# Patient Record
Sex: Male | Born: 1980
Health system: Southern US, Community
[De-identification: ages and names within clinical notes are randomized; demographics above are authoritative.]

## PROBLEM LIST (undated history)

## (undated) DIAGNOSIS — F32A Depression, unspecified: Secondary | ICD-10-CM

## (undated) DIAGNOSIS — R112 Nausea with vomiting, unspecified: Secondary | ICD-10-CM

## (undated) DIAGNOSIS — Z9889 Other specified postprocedural states: Secondary | ICD-10-CM

## (undated) DIAGNOSIS — F329 Major depressive disorder, single episode, unspecified: Secondary | ICD-10-CM

## (undated) DIAGNOSIS — F419 Anxiety disorder, unspecified: Secondary | ICD-10-CM

## (undated) HISTORY — PX: TONSILLECTOMY: SUR1361

## (undated) SURGERY — Surgical Case
Anesthesia: *Unknown

---

## 2008-10-28 ENCOUNTER — Emergency Department (HOSPITAL_COMMUNITY): Admission: EM | Admit: 2008-10-28 | Discharge: 2008-10-28 | Payer: Self-pay | Admitting: Emergency Medicine

## 2018-05-21 ENCOUNTER — Other Ambulatory Visit: Payer: Self-pay

## 2018-05-21 ENCOUNTER — Ambulatory Visit: Payer: 59 | Admitting: Physician Assistant

## 2018-05-21 ENCOUNTER — Encounter: Payer: Self-pay | Admitting: Physician Assistant

## 2018-05-21 VITALS — BP 120/80 | HR 97 | Resp 16 | Ht 73.0 in | Wt 201.0 lb

## 2018-05-21 DIAGNOSIS — F4323 Adjustment disorder with mixed anxiety and depressed mood: Secondary | ICD-10-CM | POA: Diagnosis not present

## 2018-05-21 MED ORDER — TRAZODONE HCL 50 MG PO TABS: 25.0000 mg | ORAL_TABLET | Freq: Every evening | ORAL | 3 refills | Status: DC | PRN

## 2018-05-21 MED ORDER — ESCITALOPRAM OXALATE 10 MG PO TABS: 5.0000 mg | ORAL_TABLET | Freq: Every day | ORAL | 3 refills | Status: DC

## 2018-05-21 MED ORDER — BUSPIRONE HCL 10 MG PO TABS: 5.0000 mg | ORAL_TABLET | Freq: Three times a day (TID) | ORAL | 0 refills | Status: DC

## 2018-05-21 NOTE — Patient Instructions (Addendum)
  Call [phone] to get set up with counseling. Her name is Corey SavoyBarbara Doyle.  Start the meds today.  Don't miss Lexapro unless you see me first.    IF you received an x-ray today, you will receive an invoice from [facility]. Please contact [facility] at [phone] with questions or concerns regarding your invoice.   IF you received labwork today, you will receive an invoice from [facility]. Please contact [facility] at [phone] with questions or concerns regarding your invoice.   Our billing staff will not be able to assist you with questions regarding bills from these companies.  You will be contacted with the lab results as soon as they are available. The fastest way to get your results is to activate your My Chart account. Instructions are located on the last page of this paperwork. If you have not heard from us regarding the results in 2 weeks, please contact this office.

## 2018-05-21 NOTE — Progress Notes (Signed)
05/21/2018 10:45 AM   DOB: 1981/05/29 / MRN: [id number]  SUBJECTIVE:  Corey Doyle is a 37 y.o. male presenting for long history of anxiety and depression.  Unfortunately patient is in talk with his wife at this time about divorce.  He is got a 29-year-old and a 53-year-old at home whom he cares about deeply.  He does not want a divorce.  His sleep is poor generally and if he does not take a sleep aid sleep 1 to 2 hours a night.  Tells me he has no difficulty with going to sleep but will most often wake up and cannot go back to sleep.  States that if he has a sleep aid he will sleep 4 to 5 hours.  Feels that sometimes he would be better off dead however has no plan for suicide and denies homicidality.  He tells me "I would never do it."  Works for [employer] and has been there for about 7 years.  No write-ups recently and tells me that he is functioning well at work.  Admits to both persistent sadness along with anhedonia.  He is tearful through most of the interview.  He is not opposed to seeing a counselor if I feel that he needs to.  He does drink wine on most nights and is tried to stop at the request of his wife in the past and he tells me "when quit drinking it did not make any difference in the relationship."   There is no immunization history on file for this patient.  Depression screen PHQ 2/9 05/21/2018  Decreased Interest 3  Down, Depressed, Hopeless 2  PHQ - 2 Score 5  Altered sleeping 3  Tired, decreased energy 3  Change in appetite 2  Feeling bad or failure about yourself  3  Trouble concentrating 2  Moving slowly or fidgety/restless 2  Suicidal thoughts 1  PHQ-9 Score 21  Difficult doing work/chores Somewhat difficult     He has No Known Allergies.   He  has no past medical history on file.    He  reports that he has never smoked. He has never used smokeless tobacco. He reports that he drinks about 6.0 oz of alcohol per week. He reports that he has current or past drug  history. Drug: Cocaine. He  has no sexual activity history on file. The patient  has no past surgical history on file.  His family history includes Hyperlipidemia in his mother.  Review of Systems  Psychiatric/Behavioral: Positive for depression and substance abuse. Negative for hallucinations, memory loss and suicidal ideas. The patient is nervous/anxious and has insomnia.     The problem list and medications were reviewed and updated by myself where necessary and exist elsewhere in the encounter.   OBJECTIVE:  BP 120/80   Pulse 97   Resp 16   Ht 6\' 1"  (1.854 m)   Wt 201 lb (91.2 kg)   SpO2 99%   BMI 26.52 kg/m   Physical Exam  Constitutional: He is oriented to person, place, and time. He appears well-developed. He does not appear ill.  Eyes: Pupils are equal, round, and reactive to light. Conjunctivae and EOM are normal.  Cardiovascular: Normal rate, regular rhythm, S1 normal, S2 normal, normal heart sounds, intact distal pulses and normal pulses. Exam reveals no gallop and no friction rub.  No murmur heard. Pulmonary/Chest: Effort normal. No stridor. No respiratory distress. He has no wheezes. He has no rales.  Abdominal: He exhibits  no distension.  Musculoskeletal: Normal range of motion. He exhibits no edema.  Neurological: He is alert and oriented to person, place, and time. No cranial nerve deficit. Coordination normal.  Skin: Skin is warm and dry. He is not diaphoretic.  Psychiatric:  Poor eye contact throughout the interview.  Patient tearful throughout the interview.  Thought process linear and logical.  Speech negative for tangentiality, flight of ideas, rapid and pressured speech.  Behavior is withdrawn.  He is not slowed.  He is not agitated or aggressive.No plan of suicide or homicide.  Judgment is normal.  Cognition is normal.  Nursing note and vitals reviewed.   No results found for this or any previous visit (from the past 72 hour(s)).  No results  found.  ASSESSMENT AND PLAN:  Corey was seen today for establish care.  Diagnoses and all orders for this visit:  Adjustment reaction with anxiety and depression: Patient with significant psychological burden.  I am starting him on Lexapro and pushing the dose up to 10 mg in the first week.  He needs to get some sleep.  For this I given him trazodone and advised to start with a low dose and work his way up as needed to 75 mg.  I have given him BuSpar for panic attacks.  He would like to see him back in about a month. -     escitalopram (LEXAPRO) 10 MG tablet; Take 0.5-1 tablets (5-10 mg total) by mouth daily. Start with 1/2 tab for a week then go to the full tab. -     traZODone (DESYREL) 50 MG tablet; Take 0.5-1.5 tablets (25-75 mg total) by mouth at bedtime as needed for sleep. -     busPIRone (BUSPAR) 10 MG tablet; Take 0.5-1 tablets (5-10 mg total) by mouth 3 (three) times daily. -     Ambulatory referral to Psychology    The patient is advised to call or return to clinic if he does not see an improvement in symptoms, or to seek the care of the closest emergency department if he worsens with the above plan.   Deliah BostonMichael Clark, MHS, PA-C Primary Care at [facility] 05/21/2018 10:45 AM

## 2018-05-25 ENCOUNTER — Encounter: Payer: Self-pay | Admitting: Physician Assistant

## 2018-05-26 ENCOUNTER — Encounter: Payer: Self-pay | Admitting: Physician Assistant

## 2018-06-04 ENCOUNTER — Ambulatory Visit: Payer: 59 | Admitting: Physician Assistant

## 2018-06-04 ENCOUNTER — Other Ambulatory Visit: Payer: Self-pay

## 2018-06-04 ENCOUNTER — Encounter: Payer: Self-pay | Admitting: Physician Assistant

## 2018-06-04 VITALS — BP 132/82 | HR 92 | Temp 98.3°F | Ht 73.43 in | Wt 203.4 lb

## 2018-06-04 DIAGNOSIS — K641 Second degree hemorrhoids: Secondary | ICD-10-CM

## 2018-06-04 DIAGNOSIS — M2352 Chronic instability of knee, left knee: Secondary | ICD-10-CM | POA: Diagnosis not present

## 2018-06-04 MED ORDER — HYDROCORTISONE ACETATE 25 MG RE SUPP: 25.0000 mg | Freq: Two times a day (BID) | RECTAL | 0 refills | Status: DC

## 2018-06-04 MED ORDER — IBUPROFEN 800 MG PO TABS: 400.0000 mg | ORAL_TABLET | Freq: Three times a day (TID) | ORAL | 0 refills | Status: AC

## 2018-06-04 MED ORDER — PSYLLIUM 28 % PO PACK: 1.0000 | PACK | Freq: Two times a day (BID) | ORAL | 3 refills | Status: AC

## 2018-06-04 NOTE — Patient Instructions (Signed)
     IF you received an x-ray today, you will receive an invoice from [facility]. Please contact [facility] at [phone] with questions or concerns regarding your invoice.   IF you received labwork today, you will receive an invoice from [facility]. Please contact [facility] at [phone] with questions or concerns regarding your invoice.   Our billing staff will not be able to assist you with questions regarding bills from these companies.  You will be contacted with the lab results as soon as they are available. The fastest way to get your results is to activate your My Chart account. Instructions are located on the last page of this paperwork. If you have not heard from us regarding the results in 2 weeks, please contact this office.     

## 2018-06-04 NOTE — Progress Notes (Signed)
06/04/2018 3:24 PM   DOB: February 12, 1981 / MRN: [id number]  SUBJECTIVE:  Corey Doyle is a 37 y.o. male presenting for lump about rectum which started after an episode of explosive diarrhea.  This is been present now for about 3 days.  Notes that over the first 24 hours this was exquisitely painful however today is starting to feel somewhat better.  Denies fever, chills, nausea.  Complains of left knee pain this been present for years.  Worse with physical activity and worse with working for consecutive days on concrete floors.  Tells me he had [employer] as a child.  Tried ibuprofen, Aleve, aspirin all which help temporarily but nothing alleviates the pain permanently.  Is willing to try physical therapy.  He has No Known Allergies.   He  has no past medical history on file.    He  reports that he has never smoked. He has never used smokeless tobacco. He reports that he drinks about 6.0 oz of alcohol per week. He reports that he has current or past drug history. Drug: Cocaine. He  has no sexual activity history on file. The patient  has no past surgical history on file.  His family history includes Hyperlipidemia in his mother.  Review of Systems  Gastrointestinal: Positive for diarrhea. Negative for abdominal pain, blood in stool, constipation, melena, nausea and vomiting.  Musculoskeletal: Positive for joint pain. Negative for back pain, falls, myalgias and neck pain.  Skin: Negative for rash.  Psychiatric/Behavioral: Negative for depression, substance abuse and suicidal ideas. The patient is not nervous/anxious and does not have insomnia.     The problem list and medications were reviewed and updated by myself where necessary and exist elsewhere in the encounter.   OBJECTIVE:  BP 132/82 (BP Location: Left Arm, Patient Position: Sitting, Cuff Size: Normal)   Pulse 92   Temp 98.3 F (36.8 C) (Oral)   Ht 6' 1.43" (1.865 m)   Wt 203 lb 6.4 oz (92.3 kg)   SpO2 97%   BMI 26.53  kg/m   Physical Exam  Constitutional: He is oriented to person, place, and time. He appears well-developed. He does not appear ill.  Eyes: Pupils are equal, round, and reactive to light. Conjunctivae and EOM are normal.  Cardiovascular: Normal rate.  Pulmonary/Chest: Effort normal.  Abdominal: He exhibits no distension.  Genitourinary:     Musculoskeletal: Normal range of motion.       Legs: Neurological: He is alert and oriented to person, place, and time. No cranial nerve deficit. Coordination normal.  Skin: Skin is warm and dry. He is not diaphoretic.  Psychiatric: He has a normal mood and affect.  Nursing note and vitals reviewed.   No results found for this or any previous visit (from the past 72 hour(s)).  No results found.  ASSESSMENT AND PLAN:  Corey was seen today for mass and pain.  Diagnoses and all orders for this visit:  Grade II hemorrhoids -     hydrocortisone (ANUSOL-HC) 25 MG suppository; Place 1 suppository (25 mg total) rectally 2 (two) times daily. -     psyllium (METAMUCIL SMOOTH TEXTURE) 28 % packet; Take 1 packet by mouth 2 (two) times daily. -     ibuprofen (ADVIL,MOTRIN) 800 MG tablet; Take 0.5-1 tablets (400-800 mg total) by mouth 3 (three) times daily for 10 days. Take with food. Do not take other NSAID pain reliever with this medication.  Chronic knee instability, left -     Ambulatory  referral to Physical Therapy    The patient is advised to call or return to clinic if he does not see an improvement in symptoms, or to seek the care of the closest emergency department if he worsens with the above plan.   Deliah BostonMichael Dolora Ridgely, MHS, PA-C Primary Care at [facility] 06/04/2018 3:24 PM

## 2018-06-18 ENCOUNTER — Ambulatory Visit: Payer: 59 | Admitting: Physician Assistant

## 2018-06-18 ENCOUNTER — Other Ambulatory Visit: Payer: Self-pay

## 2018-06-18 ENCOUNTER — Encounter: Payer: Self-pay | Admitting: Physician Assistant

## 2018-06-18 DIAGNOSIS — F4323 Adjustment disorder with mixed anxiety and depressed mood: Secondary | ICD-10-CM

## 2018-06-18 MED ORDER — ESCITALOPRAM OXALATE 20 MG PO TABS: 20.0000 mg | ORAL_TABLET | Freq: Every day | ORAL | 1 refills | Status: DC

## 2018-06-18 NOTE — Progress Notes (Signed)
06/18/2018 10:19 AM   DOB: [birth year] / MRN: [id number]  SUBJECTIVE:  Corey Doyle is a 37 y.o. male presenting for recheck dysthymia with associated anxiety. Symptoms present for few months.  The problem is improving. He has tried Lexapro 10 mg, BuSpar for acute panic, trazodone for sleep.  He sleeps well as long as he takes trazodone and gets about 6 hours which is reasonable for him.  Does feel that he is in better control overall and is no longer having spontaneous crying spells.  Also feels that he has more in control of his future.  He is actively looking for a new place to live as he and his wife are decidedly separating effectively yesterday.  His children do not know yet.  He has continued to drink sparingly.  Does at times feels life is not worth living however tells me today "I am not going anywhere, kids."  He does feel safe from himself.  Continues to see counseling at [facility].  Depression screen [facility] 2/9 06/18/2018 05/21/2018  Decreased Interest 1 3  Down, Depressed, Hopeless 1 2  PHQ - 2 Score 2 5  Altered sleeping 3 3  Tired, decreased energy 3 3  Change in appetite 2 2  Feeling bad or failure about yourself  2 3  Trouble concentrating 3 2  Moving slowly or fidgety/restless 3 2  Suicidal thoughts 1 1  PHQ-9 Score 19 21  Difficult doing work/chores Somewhat difficult Somewhat difficult     He has No Known Allergies.   He  has no past medical history on file.    He  reports that he has never smoked. He has never used smokeless tobacco. He reports that he drinks about 6.0 oz of alcohol per week. He reports that he has current or past drug history. Drug: Cocaine. He  has no sexual activity history on file. The patient  has no past surgical history on file.  His family history includes Hyperlipidemia in his mother.  Review of Systems  Constitutional: Negative for chills, diaphoresis and fever.  Eyes: Negative.   Respiratory: Negative for cough, hemoptysis,  sputum production, shortness of breath and wheezing.   Cardiovascular: Negative for chest pain, orthopnea and leg swelling.  Gastrointestinal: Negative for abdominal pain, blood in stool, constipation, diarrhea, heartburn, melena, nausea and vomiting.  Genitourinary: Negative for dysuria, flank pain, frequency, hematuria and urgency.  Skin: Negative for rash.  Neurological: Negative for dizziness, sensory change, speech change, focal weakness and headaches.  Psychiatric/Behavioral: Positive for depression. Negative for hallucinations, memory loss and substance abuse. The patient is not nervous/anxious and does not have insomnia.     The problem list and medications were reviewed and updated by myself where necessary and exist elsewhere in the encounter.   OBJECTIVE:  BP 106/70   Pulse 88   Temp 98.5 F (36.9 C)   Resp 16   Ht 6' 1.43" (1.865 m)   Wt 199 lb 3.2 oz (90.4 kg)   SpO2 97%   BMI 25.97 kg/m   Wt Readings from Last 3 Encounters:  06/18/18 199 lb 3.2 oz (90.4 kg)  06/04/18 203 lb 6.4 oz (92.3 kg)  05/21/18 201 lb (91.2 kg)   Temp Readings from Last 3 Encounters:  06/18/18 98.5 F (36.9 C)  06/04/18 98.3 F (36.8 C) (Oral)   BP Readings from Last 3 Encounters:  06/18/18 106/70  06/04/18 132/82  05/21/18 120/80   Pulse Readings from Last 3 Encounters:  06/18/18  88  06/04/18 92  05/21/18 97    Physical Exam  Constitutional: He is oriented to person, place, and time. He appears well-developed. He is active.  Non-toxic appearance. He does not appear ill.  Eyes: Pupils are equal, round, and reactive to light. Conjunctivae and EOM are normal.  Cardiovascular: Normal rate, regular rhythm, S1 normal, S2 normal, normal heart sounds, intact distal pulses and normal pulses. Exam reveals no gallop and no friction rub.  No murmur heard. Pulmonary/Chest: Effort normal. No stridor. No respiratory distress. He has no wheezes. He has no rales.  Abdominal: He exhibits no  distension.  Musculoskeletal: Normal range of motion. He exhibits no edema.  Neurological: He is alert and oriented to person, place, and time. He has normal strength and normal reflexes. He is not disoriented. No cranial nerve deficit or sensory deficit. He exhibits normal muscle tone. Coordination and gait normal.  Skin: Skin is warm and dry. He is not diaphoretic. No pallor.  Psychiatric: He has a normal mood and affect. His behavior is normal. Judgment and thought content normal.  Nursing note and vitals reviewed.     ASSESSMENT AND PLAN:  Corey was seen today for follow-up.  Diagnoses and all orders for this visit:  Adjustment reaction with anxiety and depression: Patient is feeling better with medications on board.  He is okay with increasing the dose of Lexapro.  He is going to continue counseling.  Of asked him to come back in 3 months to see Benny LennertSarah Weber or I be happy to see [place] at [place]. -     escitalopram (LEXAPRO) 20 MG tablet; Take 1 tablet (20 mg total) by mouth daily. Start with 1/2 tab for a week then go to the full tab.    The patient is advised to call or return to clinic if he does not see an improvement in symptoms, or to seek the care of the closest emergency department if he worsens with the above plan.   Deliah BostonMichael Armari Fussell, MHS, PA-C Primary Care at [facility] 06/18/2018 10:19 AM

## 2018-06-18 NOTE — Patient Instructions (Addendum)
  Starting in September. [facility].  Corey RomansSara Doyle.     IF you received an x-ray today, you will receive an invoice from [facility]. Please contact [facility] at [phone] with questions or concerns regarding your invoice.   IF you received labwork today, you will receive an invoice from [facility]. Please contact [facility] at [phone] with questions or concerns regarding your invoice.   Our billing staff will not be able to assist you with questions regarding bills from these companies.  You will be contacted with the lab results as soon as they are available. The fastest way to get your results is to activate your My Chart account. Instructions are located on the last page of this paperwork. If you have not heard from us regarding the results in 2 weeks, please contact this office.

## 2018-06-19 ENCOUNTER — Other Ambulatory Visit: Payer: Self-pay | Admitting: Physician Assistant

## 2018-06-19 DIAGNOSIS — F4323 Adjustment disorder with mixed anxiety and depressed mood: Secondary | ICD-10-CM

## 2018-08-10 ENCOUNTER — Telehealth: Payer: Self-pay | Admitting: Physician Assistant

## 2018-09-18 ENCOUNTER — Ambulatory Visit: Payer: 59 | Admitting: Physician Assistant

## 2018-09-25 ENCOUNTER — Ambulatory Visit (INDEPENDENT_AMBULATORY_CARE_PROVIDER_SITE_OTHER): Payer: 59 | Admitting: Family Medicine

## 2018-09-25 ENCOUNTER — Encounter: Payer: Self-pay | Admitting: Family Medicine

## 2018-09-25 ENCOUNTER — Other Ambulatory Visit: Payer: Self-pay

## 2018-09-25 DIAGNOSIS — F4323 Adjustment disorder with mixed anxiety and depressed mood: Secondary | ICD-10-CM | POA: Diagnosis not present

## 2018-09-25 MED ORDER — ESCITALOPRAM OXALATE 20 MG PO TABS: 20.0000 mg | ORAL_TABLET | Freq: Every day | ORAL | 1 refills | Status: AC

## 2018-09-25 MED ORDER — BUSPIRONE HCL 10 MG PO TABS: 5.0000 mg | ORAL_TABLET | Freq: Three times a day (TID) | ORAL | 0 refills | Status: DC

## 2018-09-25 MED ORDER — TRAZODONE HCL 50 MG PO TABS: 25.0000 mg | ORAL_TABLET | Freq: Every evening | ORAL | 3 refills | Status: AC | PRN

## 2018-09-25 NOTE — Progress Notes (Signed)
Subjective:  By signing my name below, I, Stann Ore, attest that this documentation has been prepared under the direction and in the presence of Meredith Staggers, MD. Electronically Signed: Stann Ore, Scribe. 09/25/2018 , 4:03 PM .  Patient was seen in Room 9 .   Patient ID: Corey Doyle, male    DOB: 30-Oct-1981, 37 y.o.   MRN: [id number] Chief Complaint  Patient presents with  . Depression    PHQ9=11  . Medication Refill    lexapro and Buspar   HPI Corey Doyle is a 37 y.o. male  Here for follow up of depression and anxiety. He was last seen by Deliah Boston on July 1st. He had been dysthymia with associated anxiety. He had been on Lexapro 10 mg, Buspar for panic symptoms, and Trazodone for sleep. He was going through separation with his wife. His symptoms were improving in July. He does see counseling at [facility] office. He was increased on dose of Lexapro 20 mg qd.   Patient states Lexapro 20 mg qd has been doing pretty well for him. He's noticed change where he's less erratic and less up/down moods. He has missed some doses on days where he's off work. He denies any side effects on this higher dose. He takes trazodone as needed, with dose depending on when he goes to bed: before 9:00 PM, he would take a full tablet, and after 9:00 PM, he would take a half tablet. He also takes Buspar prn, about 1-2 a week full tablet. He hasn't been meeting with Vickey Sages as much recently; but has been emailing his therapist. He denies SI/HI. He usually relieves stress with walking the dog and working.   He's going through separation at the moment. He has 2 children, an 38 year old daughter and a 63 year old son. 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   Positive Depression Screening Depression screen [facility] 2/9 09/25/2018 06/18/2018 06/04/2018 05/21/2018  Decreased Interest 1 1 0 3  Down, Depressed, Hopeless 1 1 0 2  PHQ - 2 Score 2 2 0 5  Altered sleeping 3 3 - 3  Tired, decreased  energy 3 3 - 3  Change in appetite 1 2 - 2  Feeling bad or failure about yourself  1 2 - 3  Trouble concentrating 1 3 - 2  Moving slowly or fidgety/restless 0 3 - 2  Suicidal thoughts 0 1 - 1  PHQ-9 Score 11 19 - 21  Difficult doing work/chores Somewhat difficult Somewhat difficult - Somewhat difficult     There are no active problems to display for this patient.  No past medical history on file. No past surgical history on file. No Known Allergies Prior to Admission medications   Medication Sig Start Date End Date Taking? Authorizing Provider  busPIRone (BUSPAR) 10 MG tablet Take 0.5-1 tablets (5-10 mg total) by mouth 3 (three) times daily. 05/21/18   Ofilia Neas, PA-C  escitalopram (LEXAPRO) 20 MG tablet Take 1 tablet (20 mg total) by mouth daily. 06/18/18   Ofilia Neas, PA-C  hydrocortisone (ANUSOL-HC) 25 MG suppository Place 1 suppository (25 mg total) rectally 2 (two) times daily. Patient not taking: Reported on 06/18/2018 06/04/18   Ofilia Neas, PA-C  psyllium (METAMUCIL SMOOTH TEXTURE) 28 % packet Take 1 packet by mouth 2 (two) times daily. 06/04/18   Ofilia Neas, PA-C  traZODone (DESYREL) 50 MG tablet Take 0.5-1.5 tablets (25-75 mg total) by mouth at bedtime as needed  for sleep. 05/21/18   Ofilia Neas, PA-C   Social History   Socioeconomic History  . Marital status: Single    Spouse name: Not on file  . Number of children: Not on file  . Years of education: Not on file  . Highest education level: Not on file  Occupational History  . Not on file  Social Needs  . Financial resource strain: Not on file  . Food insecurity:    Worry: Not on file    Inability: Not on file  . Transportation needs:    Medical: Not on file    Non-medical: Not on file  Tobacco Use  . Smoking status: Never Smoker  . Smokeless tobacco: Never Used  Substance and Sexual Activity  . Alcohol use: Yes    Alcohol/week: 10.0 standard drinks    Types: 10 Glasses of wine per week     Comment: per week  . Drug use: Not Currently    Types: Cocaine    Comment: 1 time a month   . Sexual activity: Not on file  Lifestyle  . Physical activity:    Days per week: Not on file    Minutes per session: Not on file  . Stress: Not on file  Relationships  . Social connections:    Talks on phone: Not on file    Gets together: Not on file    Attends religious service: Not on file    Active member of club or organization: Not on file    Attends meetings of clubs or organizations: Not on file    Relationship status: Not on file  . Intimate partner violence:    Fear of current or ex partner: Not on file    Emotionally abused: Not on file    Physically abused: Not on file    Forced sexual activity: Not on file  Other Topics Concern  . Not on file  Social History Narrative  . Not on file   Review of Systems  Constitutional: Negative for fatigue and unexpected weight change.  Eyes: Negative for visual disturbance.  Respiratory: Negative for cough, chest tightness and shortness of breath.   Cardiovascular: Negative for chest pain, palpitations and leg swelling.  Gastrointestinal: Negative for abdominal pain and blood in stool.  Neurological: Negative for dizziness, light-headedness and headaches.  Psychiatric/Behavioral: Positive for dysphoric mood. Negative for self-injury and suicidal ideas.       Objective:   Physical Exam  Constitutional: He is oriented to person, place, and time. He appears well-developed and well-nourished. No distress.  HENT:  Head: Normocephalic and atraumatic.  Eyes: Pupils are equal, round, and reactive to light. EOM are normal.  Neck: Neck supple.  Cardiovascular: Normal rate.  Pulmonary/Chest: Effort normal. No respiratory distress.  Musculoskeletal: Normal range of motion.  Neurological: He is alert and oriented to person, place, and time.  Skin: Skin is warm and dry.  Psychiatric: He has a normal mood and affect. His behavior is normal.    Nursing note and vitals reviewed.   Vitals:   09/25/18 1523 09/25/18 1527  BP: (!) 167/107 140/90  Pulse: (!) 105   Temp: 97.9 F (36.6 C)   TempSrc: Oral   SpO2: 98%   Weight: 208 lb (94.3 kg)   Height: 6\' 2"  (1.88 m)        Assessment & Plan:   Corey Doyle is a 37 y.o. male Adjustment reaction with anxiety and depression - Plan: escitalopram (LEXAPRO) 20 MG tablet, traZODone (  DESYREL) 50 MG tablet, busPIRone (BUSPAR) 10 MG tablet  -Overall improved, consistent dosing of SSRI discussed, continue same dose Lexapro and trazodone to help with sleep.  BuSpar as needed, continue follow-up with therapist.  Recheck 3 months..  Meds ordered this encounter  Medications  . escitalopram (LEXAPRO) 20 MG tablet    Sig: Take 1 tablet (20 mg total) by mouth daily.    Dispense:  90 tablet    Refill:  1  . traZODone (DESYREL) 50 MG tablet    Sig: Take 0.5-1.5 tablets (25-75 mg total) by mouth at bedtime as needed for sleep.    Dispense:  30 tablet    Refill:  3  . busPIRone (BUSPAR) 10 MG tablet    Sig: Take 0.5-1 tablets (5-10 mg total) by mouth 3 (three) times daily.    Dispense:  90 tablet    Refill:  0   Patient Instructions       If you have lab work done today you will be contacted with your lab results within the next 2 weeks.  If you have not heard from [place] then please contact us. The fastest way to get your results is to register for My Chart.   IF you received an x-ray today, you will receive an invoice from [facility]. Please contact [facility] at [phone] with questions or concerns regarding your invoice.   IF you received labwork today, you will receive an invoice from [place] [facility]. Please contact [facility] at [phone] with questions or concerns regarding your invoice.   Our billing staff will not be able to assist you with questions regarding bills from these companies.  You will be contacted with the lab results as soon as they are  available. The fastest way to get your results is to activate your My Chart account. Instructions are located on the last page of this paperwork. If you have not heard from [place] regarding the results in 2 weeks, please contact this office.       I personally performed the services described in this documentation, which was scribed in my presence. The recorded information has been reviewed and considered for accuracy and completeness, addended by me as needed, and agree with information above.  Signed,   Meredith Staggers, MD Primary Care at [facility].  09/29/18 11:08 PM

## 2018-09-25 NOTE — Patient Instructions (Signed)
° ° ° °  If you have lab work done today you will be contacted with your lab results within the next 2 weeks.  If you have not heard from us then please contact us. The fastest way to get your results is to register for My Chart. ° ° °IF you received an x-ray today, you will receive an invoice from [facility]. Please contact [facility] at [phone] with questions or concerns regarding your invoice.  ° °IF you received labwork today, you will receive an invoice from [facility]. Please contact [facility] at [phone] with questions or concerns regarding your invoice.  ° °Our billing staff will not be able to assist you with questions regarding bills from these companies. ° °You will be contacted with the lab results as soon as they are available. The fastest way to get your results is to activate your My Chart account. Instructions are located on the last page of this paperwork. If you have not heard from us regarding the results in 2 weeks, please contact this office. °  ° ° ° °

## 2018-10-11 NOTE — Telephone Encounter (Signed)
Done

## 2018-10-30 ENCOUNTER — Other Ambulatory Visit: Payer: Self-pay | Admitting: Family Medicine

## 2018-10-30 DIAGNOSIS — F4323 Adjustment disorder with mixed anxiety and depressed mood: Secondary | ICD-10-CM

## 2018-10-30 NOTE — Telephone Encounter (Signed)
Requested medication (s) are due for refill today: yes  Requested medication (s) are on the active medication list: yes  Last refill:  09/25/18  Future visit scheduled: yes  Notes to clinic:  Not delegated    Requested Prescriptions  Pending Prescriptions Disp Refills   busPIRone (BUSPAR) 10 MG tablet [Pharmacy Med Name: BUSPIRONE HCL 10 MG TABLET] 90 tablet 0    Sig: TAKE 1/2 TO 1 TABLET BY MOUTH 3 TIMES DAILY.     Not Delegated - Psychiatry:  Anxiolytics/Hypnotics Failed - 10/30/2018  2:10 AM      Failed - This refill cannot be delegated      Failed - Urine Drug Screen completed in last 360 days.      Passed - Valid encounter within last 6 months    Recent Outpatient Visits          1 month ago Adjustment reaction with anxiety and depression   [facility], Asencion PartridgeJeffrey R, MD   4 months ago Adjustment reaction with anxiety and depression   [facility], Marolyn HammockMichael L, PA-C   4 months ago Grade II hemorrhoids   [facility], Marolyn HammockMichael L, PA-C   5 months ago Adjustment reaction with anxiety and depression   [facility], Marolyn HammockMichael L, PA-C      Future Appointments            In 1 month Neva SeatGreene, Asencion PartridgeJeffrey R, MD [facility]

## 2018-11-01 NOTE — Telephone Encounter (Signed)
Your patient is requesting medication refill 

## 2018-11-01 NOTE — Telephone Encounter (Signed)
Refilled

## 2018-11-28 ENCOUNTER — Emergency Department (HOSPITAL_COMMUNITY): Payer: 59

## 2018-11-28 ENCOUNTER — Emergency Department (HOSPITAL_COMMUNITY)
Admission: EM | Admit: 2018-11-28 | Discharge: 2018-11-28 | Disposition: A | Discharge status: Home / Self Care | Payer: 59 | Attending: Emergency Medicine | Admitting: Emergency Medicine

## 2018-11-28 DIAGNOSIS — S62605A Fracture of unspecified phalanx of left ring finger, initial encounter for closed fracture: Secondary | ICD-10-CM | POA: Diagnosis not present

## 2018-11-28 DIAGNOSIS — Z23 Encounter for immunization: Secondary | ICD-10-CM | POA: Diagnosis not present

## 2018-11-28 DIAGNOSIS — Y9389 Activity, other specified: Secondary | ICD-10-CM | POA: Diagnosis not present

## 2018-11-28 DIAGNOSIS — R58 Hemorrhage, not elsewhere classified: Secondary | ICD-10-CM | POA: Diagnosis not present

## 2018-11-28 DIAGNOSIS — Z79899 Other long term (current) drug therapy: Secondary | ICD-10-CM | POA: Diagnosis not present

## 2018-11-28 DIAGNOSIS — Y92410 Unspecified street and highway as the place of occurrence of the external cause: Secondary | ICD-10-CM | POA: Insufficient documentation

## 2018-11-28 DIAGNOSIS — E162 Hypoglycemia, unspecified: Secondary | ICD-10-CM | POA: Diagnosis not present

## 2018-11-28 DIAGNOSIS — S060X0A Concussion without loss of consciousness, initial encounter: Secondary | ICD-10-CM | POA: Diagnosis not present

## 2018-11-28 DIAGNOSIS — S0101XA Laceration without foreign body of scalp, initial encounter: Secondary | ICD-10-CM | POA: Insufficient documentation

## 2018-11-28 DIAGNOSIS — S0990XA Unspecified injury of head, initial encounter: Secondary | ICD-10-CM | POA: Diagnosis present

## 2018-11-28 DIAGNOSIS — S62633A Displaced fracture of distal phalanx of left middle finger, initial encounter for closed fracture: Secondary | ICD-10-CM | POA: Diagnosis not present

## 2018-11-28 DIAGNOSIS — S62625A Displaced fracture of medial phalanx of left ring finger, initial encounter for closed fracture: Secondary | ICD-10-CM | POA: Diagnosis not present

## 2018-11-28 DIAGNOSIS — E161 Other hypoglycemia: Secondary | ICD-10-CM | POA: Diagnosis not present

## 2018-11-28 DIAGNOSIS — Y999 Unspecified external cause status: Secondary | ICD-10-CM | POA: Insufficient documentation

## 2018-11-28 LAB — CBC WITH DIFFERENTIAL/PLATELET
ABS IMMATURE GRANULOCYTES: 0.02 10*3/uL (ref 0.00–0.07)
Basophils Absolute: 0 10*3/uL (ref 0.0–0.1)
Basophils Relative: 1 %
Eosinophils Absolute: 0.1 10*3/uL (ref 0.0–0.5)
Eosinophils Relative: 1 %
HCT: 50.8 % (ref 39.0–52.0)
HEMOGLOBIN: 16.6 g/dL (ref 13.0–17.0)
Immature Granulocytes: 0 %
Lymphocytes Relative: 37 %
Lymphs Abs: 2.3 10*3/uL (ref 0.7–4.0)
MCH: 31 pg (ref 26.0–34.0)
MCHC: 32.7 g/dL (ref 30.0–36.0)
MCV: 95 fL (ref 80.0–100.0)
Monocytes Absolute: 0.5 10*3/uL (ref 0.1–1.0)
Monocytes Relative: 8 %
Neutro Abs: 3.3 10*3/uL (ref 1.7–7.7)
Neutrophils Relative %: 53 %
Platelets: 277 10*3/uL (ref 150–400)
RBC: 5.35 MIL/uL (ref 4.22–5.81)
RDW: 12.4 % (ref 11.5–15.5)
WBC: 6.3 10*3/uL (ref 4.0–10.5)
nRBC: 0 % (ref 0.0–0.2)

## 2018-11-28 LAB — BASIC METABOLIC PANEL
Anion gap: 11 (ref 5–15)
BUN: 13 mg/dL (ref 6–20)
CO2: 22 mmol/L (ref 22–32)
CREATININE: 1.09 mg/dL (ref 0.61–1.24)
Calcium: 8.8 mg/dL — ABNORMAL LOW (ref 8.9–10.3)
Chloride: 107 mmol/L (ref 98–111)
GFR calc Af Amer: >60 mL/min (ref >60)
GFR calc non Af Amer: >60 mL/min (ref >60)
Glucose, Bld: 106 mg/dL — ABNORMAL HIGH (ref 70–99)
POTASSIUM: 4 mmol/L (ref 3.5–5.1)
Sodium: 140 mmol/L (ref 135–145)

## 2018-11-28 MED ORDER — TETANUS-DIPHTH-ACELL PERTUSSIS 5-2.5-18.5 LF-MCG/0.5 IM SUSP
0.5000 mL | Freq: Once | INTRAMUSCULAR | Status: AC
  Administered 2018-11-28: 0.5 mL via INTRAMUSCULAR
  Filled 2018-11-28: qty 0.5

## 2018-11-28 MED ORDER — LIDOCAINE-EPINEPHRINE (PF) 2 %-1:200000 IJ SOLN
10.0000 mL | Freq: Once | INTRAMUSCULAR | Status: AC
  Administered 2018-11-28: 10 mL via INTRADERMAL
  Filled 2018-11-28: qty 20
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
## 2018-11-28 NOTE — Discharge Instructions (Signed)
If you develop vomiting, severe headache, weakness or numbness in your arms or legs, blurry vision, or any other new/concerning symptoms then return to the ER for evaluation

## 2018-11-28 NOTE — ED Notes (Signed)
Notified Doctor of patient's heart rate who spoke with patient and able to be discharged. Patient verbalized understanding of discharge instructions.

## 2018-11-28 NOTE — ED Notes (Signed)
[place] at bedside to place staples.
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
## 2018-11-28 NOTE — Progress Notes (Signed)
Orthopedic Tech Progress Note Patient Details:  Corey Doyle 03-Apr-1981 [id number]  Ortho Devices Type of Ortho Device: Finger splint Ortho Device/Splint Location: lue 4th finger Ortho Device/Splint Interventions: Ordered, Application, Adjustment   Post Interventions Patient Tolerated: Well Instructions Provided: Care of device, Adjustment of device   [name], [name] 11/28/2018, 11:40 AM

## 2018-12-05 ENCOUNTER — Encounter: Payer: Self-pay | Admitting: Family Medicine

## 2018-12-21 DIAGNOSIS — S62625A Displaced fracture of medial phalanx of left ring finger, initial encounter for closed fracture: Secondary | ICD-10-CM | POA: Diagnosis not present

## 2018-12-24 ENCOUNTER — Ambulatory Visit: Payer: 59 | Admitting: Family Medicine
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
## 2019-01-04 ENCOUNTER — Encounter (HOSPITAL_COMMUNITY): Payer: Self-pay | Admitting: *Deleted

## 2019-01-04 ENCOUNTER — Other Ambulatory Visit: Payer: Self-pay

## 2019-01-04 NOTE — Progress Notes (Signed)
Spoke with pt for pre-op call. Pt denies cardiac history, HTN or Diabetes. 

## 2019-01-07 ENCOUNTER — Ambulatory Visit (HOSPITAL_COMMUNITY): Payer: 59 | Admitting: Anesthesiology

## 2019-01-07 ENCOUNTER — Ambulatory Visit (HOSPITAL_COMMUNITY)
Admission: RE | Admit: 2019-01-07 | Discharge: 2019-01-07 | Disposition: A | Discharge status: Home / Self Care | Payer: 59 | Attending: Orthopedic Surgery | Admitting: Orthopedic Surgery

## 2019-01-07 ENCOUNTER — Encounter (HOSPITAL_COMMUNITY): Payer: Self-pay | Admitting: *Deleted

## 2019-01-07 ENCOUNTER — Encounter (HOSPITAL_COMMUNITY)
Admission: RE | Disposition: A | Discharge status: Home / Self Care | Payer: Self-pay | Source: Home / Self Care | Attending: Orthopedic Surgery

## 2019-01-07 DIAGNOSIS — F17200 Nicotine dependence, unspecified, uncomplicated: Secondary | ICD-10-CM | POA: Insufficient documentation

## 2019-01-07 DIAGNOSIS — S62625A Displaced fracture of medial phalanx of left ring finger, initial encounter for closed fracture: Secondary | ICD-10-CM | POA: Diagnosis not present

## 2019-01-07 DIAGNOSIS — S62615A Displaced fracture of proximal phalanx of left ring finger, initial encounter for closed fracture: Secondary | ICD-10-CM | POA: Diagnosis not present

## 2019-01-07 DIAGNOSIS — S62625D Displaced fracture of medial phalanx of left ring finger, subsequent encounter for fracture with routine healing: Secondary | ICD-10-CM | POA: Diagnosis not present

## 2019-01-07 HISTORY — DX: Anxiety disorder, unspecified: F41.9

## 2019-01-07 HISTORY — DX: Other specified postprocedural states: Z98.890

## 2019-01-07 HISTORY — DX: Other specified postprocedural states: R11.2

## 2019-01-07 HISTORY — DX: Depression, unspecified: F32.A

## 2019-01-07 HISTORY — PX: PERCUTANEOUS PINNING: SHX2209

## 2019-01-07 HISTORY — DX: Major depressive disorder, single episode, unspecified: F32.9

## 2019-01-07 LAB — CBC
HCT: 51.7 % (ref 39.0–52.0)
Hemoglobin: 17.9 g/dL — ABNORMAL HIGH (ref 13.0–17.0)
MCH: 32.2 pg (ref 26.0–34.0)
MCHC: 34.6 g/dL (ref 30.0–36.0)
MCV: 93 fL (ref 80.0–100.0)
NRBC: 0 % (ref 0.0–0.2)
Platelets: 254 10*3/uL (ref 150–400)
RBC: 5.56 MIL/uL (ref 4.22–5.81)
RDW: 11.8 % (ref 11.5–15.5)
WBC: 5.9 10*3/uL (ref 4.0–10.5)

## 2019-01-07 LAB — COMPREHENSIVE METABOLIC PANEL
ALT: 35 U/L (ref 0–44)
AST: 26 U/L (ref 15–41)
Albumin: 4.7 g/dL (ref 3.5–5.0)
Alkaline Phosphatase: 42 U/L (ref 38–126)
Anion gap: 11 (ref 5–15)
BUN: 14 mg/dL (ref 6–20)
CO2: 21 mmol/L — ABNORMAL LOW (ref 22–32)
Calcium: 9.3 mg/dL (ref 8.9–10.3)
Chloride: 105 mmol/L (ref 98–111)
Creatinine, Ser: 0.96 mg/dL (ref 0.61–1.24)
GFR calc Af Amer: >60 mL/min (ref >60)
Glucose, Bld: 92 mg/dL (ref 70–99)
Potassium: 4 mmol/L (ref 3.5–5.1)
Sodium: 137 mmol/L (ref 135–145)
Total Bilirubin: 1.6 mg/dL — ABNORMAL HIGH (ref 0.3–1.2)
Total Protein: 7.7 g/dL (ref 6.5–8.1)

## 2019-01-07 SURGERY — PINNING, EXTREMITY, PERCUTANEOUS
Anesthesia: General | Laterality: Left

## 2019-01-07 MED ORDER — DEXAMETHASONE SODIUM PHOSPHATE 10 MG/ML IJ SOLN
INTRAMUSCULAR | Status: DC | PRN
  Administered 2019-01-07: 10 mg via INTRAVENOUS

## 2019-01-07 MED ORDER — ONDANSETRON HCL 4 MG/2ML IJ SOLN
INTRAMUSCULAR | Status: AC
  Filled 2019-01-07: qty 2

## 2019-01-07 MED ORDER — LABETALOL HCL 5 MG/ML IV SOLN
10.0000 mg | Freq: Once | INTRAVENOUS | Status: AC
  Administered 2019-01-07: 10 mg via INTRAVENOUS

## 2019-01-07 MED ORDER — OXYCODONE HCL 5 MG PO TABS
5.0000 mg | ORAL_TABLET | Freq: Once | ORAL | Status: AC | PRN
  Administered 2019-01-07: 5 mg via ORAL

## 2019-01-07 MED ORDER — LABETALOL HCL 5 MG/ML IV SOLN
10.0000 mg | Freq: Once | INTRAVENOUS | Status: AC
  Administered 2019-01-07: 10 mg via INTRAVENOUS
  Filled 2019-01-07: qty 4

## 2019-01-07 MED ORDER — DEXAMETHASONE SODIUM PHOSPHATE 10 MG/ML IJ SOLN
INTRAMUSCULAR | Status: AC
  Filled 2019-01-07: qty 1

## 2019-01-07 MED ORDER — MIDAZOLAM HCL 2 MG/2ML IJ SOLN
INTRAMUSCULAR | Status: AC
  Filled 2019-01-07: qty 2

## 2019-01-07 MED ORDER — OXYCODONE HCL 5 MG/5ML PO SOLN: 5.0000 mg | Freq: Once | ORAL | Status: AC | PRN

## 2019-01-07 MED ORDER — LIDOCAINE 2% (20 MG/ML) 5 ML SYRINGE
INTRAMUSCULAR | Status: DC | PRN
  Administered 2019-01-07: 60 mg via INTRAVENOUS
  Administered 2019-01-07: 40 mg via INTRAVENOUS

## 2019-01-07 MED ORDER — ROCURONIUM BROMIDE 50 MG/5ML IV SOSY
PREFILLED_SYRINGE | INTRAVENOUS | Status: AC
  Filled 2019-01-07: qty 5

## 2019-01-07 MED ORDER — FENTANYL CITRATE (PF) 100 MCG/2ML IJ SOLN: 25.0000 ug | INTRAMUSCULAR | Status: DC | PRN

## 2019-01-07 MED ORDER — CHLORHEXIDINE GLUCONATE 4 % EX LIQD: 60.0000 mL | Freq: Once | CUTANEOUS | Status: DC

## 2019-01-07 MED ORDER — LIDOCAINE 2% (20 MG/ML) 5 ML SYRINGE
INTRAMUSCULAR | Status: AC
  Filled 2019-01-07: qty 5

## 2019-01-07 MED ORDER — SODIUM CHLORIDE 0.9 % IV SOLN
INTRAVENOUS | Status: DC | PRN
  Administered 2019-01-07: 30 ug/min via INTRAVENOUS

## 2019-01-07 MED ORDER — PROPOFOL 10 MG/ML IV BOLUS
INTRAVENOUS | Status: DC | PRN
  Administered 2019-01-07: 200 mg via INTRAVENOUS

## 2019-01-07 MED ORDER — PHENYLEPHRINE 40 MCG/ML (10ML) SYRINGE FOR IV PUSH (FOR BLOOD PRESSURE SUPPORT)
PREFILLED_SYRINGE | INTRAVENOUS | Status: DC | PRN
  Administered 2019-01-07: 80 ug via INTRAVENOUS
  Administered 2019-01-07: 120 ug via INTRAVENOUS
  Administered 2019-01-07: 80 ug via INTRAVENOUS
  Administered 2019-01-07: 120 ug via INTRAVENOUS

## 2019-01-07 MED ORDER — MIDAZOLAM HCL 5 MG/5ML IJ SOLN
INTRAMUSCULAR | Status: DC | PRN
  Administered 2019-01-07: 2 mg via INTRAVENOUS

## 2019-01-07 MED ORDER — BUPIVACAINE HCL (PF) 0.25 % IJ SOLN
INTRAMUSCULAR | Status: AC
  Filled 2019-01-07: qty 30

## 2019-01-07 MED ORDER — ONDANSETRON HCL 4 MG/2ML IJ SOLN: 4.0000 mg | Freq: Once | INTRAMUSCULAR | Status: DC | PRN

## 2019-01-07 MED ORDER — LABETALOL HCL 5 MG/ML IV SOLN
INTRAVENOUS | Status: AC
  Filled 2019-01-07: qty 4

## 2019-01-07 MED ORDER — CEFAZOLIN SODIUM-DEXTROSE 2-4 GM/100ML-% IV SOLN
2.0000 g | INTRAVENOUS | Status: AC
  Administered 2019-01-07: 2 g via INTRAVENOUS

## 2019-01-07 MED ORDER — PROPOFOL 10 MG/ML IV BOLUS
INTRAVENOUS | Status: AC
  Filled 2019-01-07: qty 20

## 2019-01-07 MED ORDER — FENTANYL CITRATE (PF) 100 MCG/2ML IJ SOLN
INTRAMUSCULAR | Status: DC | PRN
  Administered 2019-01-07 (×2): 25 ug via INTRAVENOUS

## 2019-01-07 MED ORDER — EPHEDRINE SULFATE-NACL 50-0.9 MG/10ML-% IV SOSY
PREFILLED_SYRINGE | INTRAVENOUS | Status: DC | PRN
  Administered 2019-01-07: 5 mg via INTRAVENOUS

## 2019-01-07 MED ORDER — FENTANYL CITRATE (PF) 250 MCG/5ML IJ SOLN
INTRAMUSCULAR | Status: AC
  Filled 2019-01-07: qty 5

## 2019-01-07 MED ORDER — ONDANSETRON HCL 4 MG/2ML IJ SOLN
INTRAMUSCULAR | Status: DC | PRN
  Administered 2019-01-07: 4 mg via INTRAVENOUS

## 2019-01-07 MED ORDER — MIDAZOLAM HCL 2 MG/2ML IJ SOLN
1.0000 mg | Freq: Once | INTRAMUSCULAR | Status: AC
  Administered 2019-01-07: 1 mg via INTRAVENOUS
  Filled 2019-01-07: qty 2
  Filled 2019-01-07: qty 1

## 2019-01-07 MED ORDER — OXYCODONE HCL 5 MG PO TABS
ORAL_TABLET | ORAL | Status: AC
  Filled 2019-01-07: qty 1

## 2019-01-07 MED ORDER — CEFAZOLIN SODIUM-DEXTROSE 2-4 GM/100ML-% IV SOLN
INTRAVENOUS | Status: AC
  Filled 2019-01-07: qty 100

## 2019-01-07 MED ORDER — BUPIVACAINE HCL (PF) 0.25 % IJ SOLN
INTRAMUSCULAR | Status: DC | PRN
  Administered 2019-01-07: 9 mL

## 2019-01-07 MED ORDER — LACTATED RINGERS IV SOLN
INTRAVENOUS | Status: DC
  Administered 2019-01-07 (×3): via INTRAVENOUS

## 2019-01-07 MED ORDER — FENTANYL CITRATE (PF) 100 MCG/2ML IJ SOLN
50.0000 ug | Freq: Once | INTRAMUSCULAR | Status: AC
  Administered 2019-01-07: 50 ug via INTRAVENOUS
  Filled 2019-01-07: qty 2
  Filled 2019-01-07: qty 1

## 2019-01-07 MED ORDER — SODIUM CHLORIDE 0.9 % IR SOLN
Status: DC | PRN
  Administered 2019-01-07: 1000 mL

## 2019-01-07 SURGICAL SUPPLY — 24 items
BANDAGE ACE 3X5.8 VEL STRL LF (GAUZE/BANDAGES/DRESSINGS) ×2 IMPLANT
BNDG COHESIVE 1X5 TAN STRL LF (GAUZE/BANDAGES/DRESSINGS) ×1 IMPLANT
BNDG STRETCH 4X75 NS LF (GAUZE/BANDAGES/DRESSINGS) ×1 IMPLANT
COVER SURGICAL LIGHT HANDLE (MISCELLANEOUS) ×2 IMPLANT
CUFF TOURNIQUET SINGLE 18IN (TOURNIQUET CUFF) ×1 IMPLANT
DRAPE OEC MINIVIEW 54X84 (DRAPES) ×2 IMPLANT
GAUZE SPONGE 4X4 12PLY STRL LF (GAUZE/BANDAGES/DRESSINGS) ×1 IMPLANT
GAUZE XEROFORM 1X8 LF (GAUZE/BANDAGES/DRESSINGS) ×1 IMPLANT
GLOVE BIOGEL PI IND STRL 8.5 (GLOVE) ×1 IMPLANT
GLOVE BIOGEL PI INDICATOR 8.5 (GLOVE) ×1
GLOVE SURG ORTHO 8.0 STRL STRW (GLOVE) ×2 IMPLANT
GOWN STRL REUS W/ TWL LRG LVL3 (GOWN DISPOSABLE) ×2 IMPLANT
GOWN STRL REUS W/TWL LRG LVL3 (GOWN DISPOSABLE) ×2
KIT BASIN OR (CUSTOM PROCEDURE TRAY) ×2 IMPLANT
KIT TURNOVER KIT B (KITS) ×2 IMPLANT
NS IRRIG 1000ML POUR BTL (IV SOLUTION) ×2 IMPLANT
PACK ORTHO EXTREMITY (CUSTOM PROCEDURE TRAY) ×2 IMPLANT
PAD ARMBOARD 7.5X6 YLW CONV (MISCELLANEOUS) ×4 IMPLANT
SOAP 2 % CHG 4 OZ (WOUND CARE) ×2 IMPLANT
SUT ETHILON 4 0 P 3 18 (SUTURE) IMPLANT
SUT ETHILON 5 0 P 3 18 (SUTURE)
SUT NYLON ETHILON 5-0 P-3 1X18 (SUTURE) IMPLANT
SUT PROLENE 4 0 P 3 18 (SUTURE) IMPLANT
k-wire ×1 IMPLANT

## 2019-01-07 NOTE — Transfer of Care (Signed)
Immediate Anesthesia Transfer of Care Note  Patient: Corey Doyle  Procedure(s) Performed: LEFT RING FINGER CLOSED REDUCTION AND PERCUTANEOUS PINNING (Left )  Patient Location: PACU  Anesthesia Type:General  Level of Consciousness: drowsy  Airway & Oxygen Therapy: Patient Spontanous Breathing and Patient connected to face mask oxygen  Post-op Assessment: Report given to RN and Post -op Vital signs reviewed and stable  Post vital signs: Reviewed and stable  Last Vitals:  Vitals Value Taken Time  BP 148/100 01/07/2019 11:35 AM  Temp    Pulse 97   Resp 15 01/07/2019 11:35 AM  SpO2 98   Vitals shown include unvalidated device data.  Last Pain:  Vitals:   01/07/19 0934  TempSrc:   PainSc: 0-No pain      Patients Stated Pain Goal: 3 (01/07/19 0934)  Complications: No apparent anesthesia complications

## 2019-01-07 NOTE — Anesthesia Procedure Notes (Signed)
Procedure Name: LMA Insertion Performed by: Tim Wilhide H, CRNA Pre-anesthesia Checklist: Patient identified, Emergency Drugs available, Suction available and Patient being monitored Patient Re-evaluated:Patient Re-evaluated prior to induction Oxygen Delivery Method: Circle System Utilized Preoxygenation: Pre-oxygenation with 100% oxygen Induction Type: IV induction Ventilation: Mask ventilation without difficulty LMA: LMA inserted LMA Size: 5.0 Number of attempts: 1 Airway Equipment and Method: Bite block Placement Confirmation: positive ETCO2 Tube secured with: Tape Dental Injury: Teeth and Oropharynx as per pre-operative assessment        

## 2019-01-07 NOTE — Anesthesia Preprocedure Evaluation (Signed)
Anesthesia Evaluation  Patient identified by MRN, date of birth, ID band Patient awake    Reviewed: Allergy & Precautions, NPO status , Patient's Chart, lab work & pertinent test results  Airway Mallampati: II  TM Distance: >3 FB Neck ROM: Full    Dental  (+) Teeth Intact, Dental Advisory Given   Pulmonary Current Smoker,    breath sounds clear to auscultation       Cardiovascular  Rhythm:Regular Rate:Normal     Neuro/Psych    GI/Hepatic   Endo/Other    Renal/GU      Musculoskeletal   Abdominal   Peds  Hematology   Anesthesia Other Findings   Reproductive/Obstetrics                             Anesthesia Physical Anesthesia Plan  ASA: III  Anesthesia Plan: General   Post-op Pain Management:    Induction: Intravenous  PONV Risk Score and Plan: Ondansetron and Dexamethasone  Airway Management Planned: LMA  Additional Equipment:   Intra-op Plan:   Post-operative Plan:   Informed Consent: I have reviewed the patients History and Physical, chart, labs and discussed the procedure including the risks, benefits and alternatives for the proposed anesthesia with the patient or authorized representative who has indicated his/her understanding and acceptance.     Dental advisory given  Plan Discussed with: CRNA and Anesthesiologist  Anesthesia Plan Comments:         Anesthesia Quick Evaluation

## 2019-01-07 NOTE — Anesthesia Postprocedure Evaluation (Signed)
Anesthesia Post Note  Patient: Corey Doyle  Procedure(s) Performed: LEFT RING FINGER CLOSED REDUCTION AND PERCUTANEOUS PINNING (Left )     Patient location during evaluation: PACU Anesthesia Type: General Level of consciousness: awake and alert Pain management: pain level controlled Vital Signs Assessment: post-procedure vital signs reviewed and stable Respiratory status: spontaneous breathing, nonlabored ventilation, respiratory function stable and patient connected to nasal cannula oxygen Cardiovascular status: blood pressure returned to baseline and stable Postop Assessment: no apparent nausea or vomiting Anesthetic complications: no    Last Vitals:  Vitals:   01/07/19 1200 01/07/19 1206  BP: (!) 144/106 125/77  Pulse: 87 70  Resp: 14 15  Temp:    SpO2: 98% 97%    Last Pain:  Vitals:   01/07/19 1206  TempSrc:   PainSc: 3                  Anjanette Gilkey COKER

## 2019-01-07 NOTE — Progress Notes (Signed)
Patient arrived for surgery with elevated blood pressure.  Patient has never been treated for high pressure before.  Patient sees Meredith Staggers MD for primary care.  Patients pressures have been elevated before but no physician has stated that he needed to be treated.  Patient denies headaches other than when he fells stress or pressure.  Upon arrival  To short stay his pressures were 176/120, 177/122, 180/172, 184/125, 172/124.  This was with the pressure being checked in different arms, and with the smaller and larger cuff.  Dr. Noreene Larsson made aware and received orders for versed, fentanyl and labetalol. Patient placed on monitors. Will continue to monitor patient
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
## 2019-01-09 ENCOUNTER — Encounter (HOSPITAL_COMMUNITY): Payer: Self-pay | Admitting: Orthopedic Surgery

## 2019-01-22 DIAGNOSIS — S62625D Displaced fracture of medial phalanx of left ring finger, subsequent encounter for fracture with routine healing: Secondary | ICD-10-CM | POA: Diagnosis not present

## 2019-01-22 DIAGNOSIS — M25642 Stiffness of left hand, not elsewhere classified: Secondary | ICD-10-CM | POA: Diagnosis not present

## 2019-02-05 DIAGNOSIS — S62624D Displaced fracture of medial phalanx of right ring finger, subsequent encounter for fracture with routine healing: Secondary | ICD-10-CM | POA: Diagnosis not present

## 2019-02-05 DIAGNOSIS — M25642 Stiffness of left hand, not elsewhere classified: Secondary | ICD-10-CM | POA: Diagnosis not present

## 2019-05-07 ENCOUNTER — Telehealth: Payer: 59 | Admitting: Physician Assistant

## 2019-05-07 DIAGNOSIS — J301 Allergic rhinitis due to pollen: Secondary | ICD-10-CM | POA: Diagnosis not present

## 2019-05-07 MED ORDER — OLOPATADINE HCL 0.2 % OP SOLN: 1.0000 [drp] | Freq: Every day | OPHTHALMIC | 0 refills | Status: AC

## 2019-05-07 MED ORDER — CETIRIZINE-PSEUDOEPHEDRINE ER 5-120 MG PO TB12: 1.0000 | ORAL_TABLET | Freq: Two times a day (BID) | ORAL | 0 refills | Status: AC

## 2019-05-07 NOTE — Progress Notes (Signed)
Hi Gus. We are sorry that you are not feeling well.  Here is how we plan to help!  Based on what you have shared with me it looks like you have Allergic Rhinitis.  Rhinitis is when a reaction occurs that causes nasal congestion, runny nose, sneezing, and itching.  Most types of rhinitis are caused by an inflammation and are associated with symptoms in the eyes ears or throat. There are several types of rhinitis.  The most common are acute rhinitis, which is usually caused by a viral illness, allergic or seasonal rhinitis, and nonallergic or year-round rhinitis.  Nasal allergies occur certain times of the year.  Allergic rhinitis is caused when allergens in the air trigger the release of histamine in the body.  Histamine causes itching, swelling, and fluid to build up in the fragile linings of the nasal passages, sinuses and eyelids.  An itchy nose and clear discharge are common.  I have prescribed zyrtec-D and pataday for your eyes. Please take the meds as prescribed.   HOME CARE:  You can use an over-the-counter saline nasal spray as needed Avoid areas where there is heavy dust, mites, or molds Stay indoors on windy days during the pollen season Keep windows closed in home, at least in bedroom; use air conditioner. Use high-efficiency house air filter Keep windows closed in car, turn AC on re-circulate Avoid playing out with dog during pollen season  GET HELP RIGHT AWAY IF:  If your symptoms do not improve within 10 days You become short of breath You develop yellow or green discharge from your nose for over 3 days You have coughing fits  MAKE SURE YOU:  Understand these instructions Will watch your condition Will get help right away if you are not doing well or get worse  Thank you for choosing an e-visit. Your e-visit answers were reviewed by a board certified advanced clinical practitioner to complete your personal care plan. Depending upon the condition, your plan could have  included both over the counter or prescription medications. Please review your pharmacy choice. Be sure that the pharmacy you have chosen is open so that you can pick up your prescription now.  If there is a problem you may message your provider in MyChart to have the prescription routed to another pharmacy. Your safety is important to us. If you have drug allergies check your prescription carefully.  For the next 24 hours, you can use MyChart to ask questions about today's visit, request a non-urgent call back, or ask for a work or school excuse from your e-visit provider. You will get an email in the next two days asking about your experience. I hope that your e-visit has been valuable and will speed your recovery.         ===View-only below this line===   ----- Message -----    From: Daryll DrownGustin Francis Lienemann    Sent: 05/07/2019  8:13 AM EDT      To: E-Visit Mailing List Subject: E-Visit Submission: Allergies  E-Visit Submission: Allergies --------------------------------  Question: What symptoms are you having? (Check all that apply) Answer:   Runny nose            Stuffy nose            Itching of the eyes, nose, and roof of mouth            Watery eyes            Ear fullness  Question: Other: Answer:  Woke up this morning with dried mucus keeping my left eye closed. Still producing mucus this morning.  Question: Do you have a fever or chills? Answer:   No  Question: Do you have a cough? Answer:   Yes, productive cough  Question: Are you short of breath or wheezing? Answer:   No  Question: Have you ever been diagnosed with asthma? Answer:   No  Question: When did your symptoms start? Answer:   1-5 days ago  Question: Have you tried any over the counter medications? Answer:   Zyrtec allergy  Question: Over the counter medications "other": Answer:   Mucinex  Question: Have any of the over the counter medications been effective? Answer:   Yes  Question: Do  you use a humidifier? Answer:   No  Question: Please list your medication allergies that you may have ? (If 'none' , please list as 'none') Answer:   None  Question: Please list any additional comments  Answer:     A total of 5-10 minutes was spent evaluating this patients questionnaire and formulating a plan of care.

## 2020-08-10 ENCOUNTER — Telehealth: Payer: 59 | Admitting: Family

## 2020-08-10 DIAGNOSIS — H0012 Chalazion right lower eyelid: Secondary | ICD-10-CM

## 2020-08-10 NOTE — Progress Notes (Signed)
Hello, Can you attach a photo? It sounds like it possibility could be a chalazion instead of a stye since it has been there for 3 months.   Jannifer Rodney, FNP

## 2020-08-10 NOTE — Progress Notes (Signed)
We are sorry that you are not feeling well. Here is how we plan to help!  Based on what you have shared with me it looks like you have a chalazion.   We have made appropriate suggestions for you based upon your presentation: Simple chalazion can be treated without medical intervention.  Most chalazion either resolve spontaneously or resolve with simple home treatment by applying warm compresses or heated washcloth to the stye for about 10-15 minutes three to four times a day. Given yours has not resolved over the last 3 months, you need to follow up with an  Ophthalmologists to have this removed.   HOME CARE:   Wash your hands often!  Let the stye open on its own. Don't squeeze or open it.  Don't rub your eyes. This can irritate your eyes and let in bacteria.  If you need to touch your eyes, wash your hands first.  Don't wear eye makeup or contact lenses until the area has healed.  GET HELP RIGHT AWAY IF:   Your symptoms do not improve.  You develop blurred or loss of vision.  Your symptoms worsen (increased discharge, pain or redness).  Thank you for choosing an e-visit.  Your e-visit answers were reviewed by a board certified advanced clinical practitioner to complete your personal care plan.  Depending upon the condition, your plan could have included both over the counter or prescription medications.  Please review your pharmacy choice.  Make sure the pharmacy is open so you can pick up prescription now.  If there is a problem, you may contact your provider through [employer] and have the prescription routed to another pharmacy.    Your safety is important to [place].  If you have drug allergies check your prescription carefully.  For the next 24 hours you can use MyChart to ask questions about today's visit, request a non-urgent call back, or ask for a work or school excuse.  You will get an email in the next two days asking about your experience.  I hope you that your  e-visit has been valuable and will speed your recovery.  Approximately 5 minutes was spent documenting and reviewing patient's chart.
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
